# Patient Record
Sex: Male | Born: 1955 | Race: Black or African American | Hispanic: No | Marital: Married | Smoking: Never smoker
Health system: Southern US, Community
[De-identification: ages and names within clinical notes are randomized; demographics above are authoritative.]

## PROBLEM LIST (undated history)

## (undated) DIAGNOSIS — J42 Unspecified chronic bronchitis: Secondary | ICD-10-CM

## (undated) DIAGNOSIS — E119 Type 2 diabetes mellitus without complications: Secondary | ICD-10-CM

## (undated) DIAGNOSIS — E079 Disorder of thyroid, unspecified: Secondary | ICD-10-CM

---

## 2008-09-07 ENCOUNTER — Encounter: Admission: RE | Admit: 2008-09-07 | Discharge: 2008-09-07 | Payer: Self-pay | Admitting: Family Medicine

## 2009-02-06 ENCOUNTER — Ambulatory Visit (HOSPITAL_BASED_OUTPATIENT_CLINIC_OR_DEPARTMENT_OTHER): Admission: RE | Admit: 2009-02-06 | Discharge: 2009-02-07 | Payer: Self-pay | Admitting: Orthopedic Surgery

## 2010-07-05 ENCOUNTER — Encounter: Payer: Self-pay | Admitting: Family Medicine

## 2010-09-19 LAB — BASIC METABOLIC PANEL
BUN: 14 mg/dL (ref 6–23)
CO2: 26 mEq/L (ref 19–32)
Calcium: 9.2 mg/dL (ref 8.4–10.5)
GFR calc Af Amer: >60 mL/min (ref >60)
GFR calc non Af Amer: >60 mL/min (ref >60)
Potassium: 3.9 mEq/L (ref 3.5–5.1)

## 2010-09-19 LAB — GLUCOSE, CAPILLARY
Glucose-Capillary: 109 mg/dL — ABNORMAL HIGH (ref 70–99)
Glucose-Capillary: 135 mg/dL — ABNORMAL HIGH (ref 70–99)

## 2010-10-27 NOTE — Op Note (Signed)
Jordan Eaton, Jordan Eaton             ACCOUNT NO.:  [id number]   MEDICAL RECORD NO.:  [id number]          PATIENT TYPE:  AMB   LOCATION:  DSC                          FACILITY:  [facility]   PHYSICIAN:  Katy Fitch. Sypher, M.D. DATE OF BIRTH:  1955-09-09   DATE OF PROCEDURE:  02/06/2009  DATE OF DISCHARGE:                               OPERATIVE REPORT   PREOPERATIVE DIAGNOSES:  Chronic right shoulder pain status post prior  right arthroscopic rotator cuff repair, subacromial decompression, and  limited distal clavicle resection performed in February 2009.   POSTOPERATIVE DIAGNOSES:  Moderate capsular contracture, limited labral  degenerative changes, possible residual pain from narrow footprint  rotator cuff repaired with an approximately 5-mm footprint equivalent to  an acquired partial-thickness articular-surface tendon avulsion,  articular surface tear, and prominent residual distal clavicle.   OPERATIONS:  1. Examination of right shoulder under anesthesia demonstrating a      stable shoulder joint with slight loss of external rotation and      extension due to mild adhesive capsulitis.  2. Limited labral degenerative changes, treated with simple      debridement.  3. Arthroscopic documentation of footprint of rotator cuff repair      demonstrating about a 5-mm footprint with extensive partial      articular surface undermining and pseudo tendon noted intra-      articularly, perhaps the source of pain.  4. Prominent inferior distal clavicle, treated with operative      resection of approximately 12 mm of the distal clavicle.   OPERATING SURGEON:  Katy Fitch. Sypher, MD   ASSISTANT:  Marveen Reeks Dasnoit, PA-C   ANESTHESIA:  General by endotracheal technique supplemented by a right  interscalene block.   SUPERVISING ANESTHESIOLOGIST:  Germaine Pomfret, MD   INDICATIONS:  Jordan Eaton is a 55 year old gentleman employed by the  [place] [employer] in the [place] [employer].   In 2008, he sustained a significant injury to his right shoulder.  He  was initially treated at [facility] and subsequently seen by Dr. Francena Hanly with [facility].  Dr. Rennis Chris identified a rotator  cuff tear and signs of chronic impingement.  He took Jordan Eaton to the  operating room in February 2009 and performed an arthroscopic  subacromial decompression, a rotator cuff repair, labral debridement,  and a limited resection of the distal clavicle.   Jordan Eaton went to rehab and had persistent impingement-type symptoms.  These were treated with steroid injection and therapy.  With persistent  pain and alternative, upper extremity orthopedic consult was requested.   Jordan Eaton reported chronic night pain, pain sleeping on the shoulder,  impingement-type symptoms, and on exam appeared to have evidence of  residual impingement beneath the distal clavicle.   His MRI following surgery was studied and had what appeared to be an  intact rotator cuff repair, although it was challenging to interpret the  dimensions of the rotator cuff footprint.   Due to his persistent pain and work impairment, we recommended a re-  examination of his shoulder under anesthesia and arthroscopic evaluation  of the repair as well as his distal clavicle.   Preoperatively, he complained that he could not lift his body weight up  into his trucks at work.  He had enough shoulder impairment that he did  not feel he could return to work.  It should be noted that he is a very  large man weighing 270 pounds.   We advised him that we would do our best to proceed with a orderly  evaluation of his shoulder.  Should we find that his rotator cuff repair  has partially failed, it may be necessary to repeat his rotator cuff  repair.   Questions regarding the anticipated surgery were invited and answered in  detail.  He was interviewed by Dr. Jean Rosenthal preoperatively and advised to  undergo  interscalene block.  This was placed without complication in the  holding area.   After a proper site identification protocol was followed, Jordan Eaton is  brought to the operating room at this time.   PROCEDURE IN DETAIL:  Jordan Eaton was brought to room 5 at the [facility] and placed in supine position on the operating table.  Under Dr. Edison Pace direct supervision, general endotracheal anesthesia  was induced followed by careful positioning in the beach-chair position  with the aid of a torso and head holder designed for shoulder  arthroscopy.   Examination of right shoulder under anesthesia revealed combined  elevation to 170, external rotation to 85 degrees, extension to neutral,  and internal rotation of at least 70 degrees.  He was stable to anterior-  posterior testing.   The right arm and forequarter were then prepped with DuraPrep and draped  with impervious arthroscopy drapes.  The shoulder was distended with 20  mL of sterile saline with a spinal needle anteriorly and a scope placed  over a switching stick from posterior approach with blunt technique.   Diagnostic arthroscopy revealed limited labral degenerative changes at 2  o'clock anteriorly, superiorly, and posteriorly.  There was some  capsular fibrosis from his previous anterior portal that was adhering  the subscapularis and perhaps limiting his abduction and external  rotation.   An anterior portal was created under direct vision followed by  meticulous debridement of the capsular adhesions off the subscapularis  and limited debridement of the labrum.  The rotator cuff tear was  inspected.  The suture anchors were visible.  There was a fairly  significant 1 cm wide or more medial footprint with relatively lateral  placements of the anchor relative to the articular margin.  This had the  appearance of an acquired PASTA type lesion.  A spinal needle was used  in an effort to gauge the thickness of  the repair footprint.  Our best  estimate was approximately 15 mm based on palpation off the skin margin  with penetration of the tendon.   The repair and sutures visualized were documented in the digital camera.   The operating room did not have a [place] rotator cuff probe to  precisely measure the footprint.   The scope was removed from the glenohumeral joint and placed in the  subacromial space.  There was florid bursitis noted.  After cleaning the  bursa, the anatomy of the coracoacromial arch was studied.  A very  satisfactory acromioplasty was performed at the index surgery.  The  coracoacromial ligament was present.  There were adhesions between the  coracoacromial ligament and the rotator cuff which were lysed with a  suction shaver  and electrocautery.  The profile of the clavicle was  noted to be prominent.  This was identified and documented in the  digital camera followed by use of a suction bur to resect the distal 12-  mm clavicle circumferentially.  Documentation of final clavicle  resection was accomplished with a digital camera.   We carefully inspected the rotator cuff repair from the bursal side.  The arthroscopic replaced knots were covered with scar but visible.  This was an intact repair with a limited footprint.   Given our preoperative alliance and contract to proceed with distal  clavicle resection and examination, no effort was made to change the  footprint of the repair.  We will rehabilitate Jordan Eaton following this  procedure and determine whether or not further intervention would be  advised.   COMPLICATIONS:  There were no apparent complications.      Katy Fitch Sypher, M.D.  Electronically Signed     RVS/MEDQ  D:  02/06/2009  T:  02/07/2009  Job:  [id number]

## 2011-06-30 ENCOUNTER — Ambulatory Visit (INDEPENDENT_AMBULATORY_CARE_PROVIDER_SITE_OTHER): Payer: Medicare Other

## 2011-06-30 DIAGNOSIS — I10 Essential (primary) hypertension: Secondary | ICD-10-CM

## 2011-06-30 DIAGNOSIS — M549 Dorsalgia, unspecified: Secondary | ICD-10-CM

## 2011-06-30 DIAGNOSIS — Z23 Encounter for immunization: Secondary | ICD-10-CM

## 2011-06-30 DIAGNOSIS — E119 Type 2 diabetes mellitus without complications: Secondary | ICD-10-CM

## 2011-07-13 ENCOUNTER — Other Ambulatory Visit: Payer: Self-pay | Admitting: Family Medicine

## 2011-07-13 DIAGNOSIS — M545 Low back pain, unspecified: Secondary | ICD-10-CM

## 2011-07-17 ENCOUNTER — Other Ambulatory Visit: Payer: Self-pay

## 2011-07-18 ENCOUNTER — Ambulatory Visit
Admission: RE | Admit: 2011-07-18 | Discharge: 2011-07-18 | Disposition: A | Discharge status: Home / Self Care | Payer: Self-pay | Source: Ambulatory Visit | Attending: Family Medicine | Admitting: Family Medicine

## 2011-07-18 DIAGNOSIS — M545 Low back pain: Secondary | ICD-10-CM

## 2011-08-31 ENCOUNTER — Encounter: Payer: Self-pay | Admitting: Family Medicine

## 2011-10-01 ENCOUNTER — Other Ambulatory Visit: Payer: Self-pay | Admitting: Family Medicine

## 2011-10-02 ENCOUNTER — Telehealth: Payer: Self-pay

## 2011-10-02 NOTE — Telephone Encounter (Signed)
Verfied pharmacy to fax RX

## 2011-11-01 ENCOUNTER — Ambulatory Visit (INDEPENDENT_AMBULATORY_CARE_PROVIDER_SITE_OTHER): Payer: Medicare Other | Admitting: Family Medicine

## 2011-11-01 DIAGNOSIS — E119 Type 2 diabetes mellitus without complications: Secondary | ICD-10-CM

## 2011-11-01 DIAGNOSIS — M549 Dorsalgia, unspecified: Secondary | ICD-10-CM

## 2011-11-01 DIAGNOSIS — M6289 Other specified disorders of muscle: Secondary | ICD-10-CM

## 2011-11-01 DIAGNOSIS — I10 Essential (primary) hypertension: Secondary | ICD-10-CM

## 2011-11-01 LAB — BASIC METABOLIC PANEL
BUN: 15 mg/dL (ref 6–23)
Chloride: 105 mEq/L (ref 96–112)
Glucose, Bld: 115 mg/dL — ABNORMAL HIGH (ref 70–99)
Potassium: 4.3 mEq/L (ref 3.5–5.3)
Sodium: 141 mEq/L (ref 135–145)

## 2011-11-01 LAB — POCT CBC
Granulocyte percent: 59.5 %G (ref 37–80)
HCT, POC: 46 % (ref 43.5–53.7)
MCV: 91 fL (ref 80–97)
POC Granulocyte: 5.2 (ref 2–6.9)
RBC: 5.05 M/uL (ref 4.69–6.13)

## 2011-11-01 MED ORDER — HYDROCODONE-ACETAMINOPHEN 5-325 MG PO TABS: 1.0000 | ORAL_TABLET | Freq: Three times a day (TID) | ORAL | Status: DC | PRN

## 2011-11-01 NOTE — Progress Notes (Signed)
Patient Name: Jordan Eaton Date of Birth: [birth year] Medical Record Number: [id number] Gender: male Date of Encounter: 11/01/2011  History of Present Illness:  Jordan Eaton is a 56 y.o. very pleasant male patient who presents with the following:  Here today to follow-up his DM and chroinic pain.  From what he tells me he did see Dr. Althea Charon at [facility] ortho- however it sounds like he did not want to follow- through with the possible plan of doing injections for his back. He continues to have have chronic shoulder and back pain, but states that he is taking his norco BID or TID.  He does not always have to take it- his pain seems to wax and wane.  He actually plans to have shoulder surgery in a few months  There is no problem list on file for this patient.  No past medical history on file. No past surgical history on file. History  Substance Use Topics  . Smoking status: Never Smoker   . Smokeless tobacco: Not on file  . Alcohol Use: No   No family history on file. No Known Allergies  Medication list has been reviewed and updated.  Review of Systems: As per HPI- otherwise negative.   Physical Examination: Filed Vitals:   11/01/11 1154  BP: 115/77  Pulse: 82  Temp: 97.9 F (36.6 C)  TempSrc: Oral  Resp: 16  Height: 5\' 9"  (1.753 m)  Weight: 261 lb 12.8 oz (118.752 kg)  SpO2: 100%    Body mass index is 38.66 kg/(m^2).  GEN: WDWN, NAD, Non-toxic, A & O x 3, obese HEENT: Atraumatic, Normocephalic. Neck supple. No masses, No LAD.  TM, oropharynx wnl Ears and Nose: No external deformity. CV: RRR, No M/G/R. No JVD. No thrill. No extra heart sounds. PULM: CTA B, no wheezes, crackles, rhonchi. No retractions. No resp. distress. No accessory muscle use. ABD: S, NT, ND, +BS. No rebound. No HSM. EXTR: No c/c/e NEURO Normal gait.  PSYCH: Normally interactive. Conversant. Not depressed or anxious appearing.  Calm demeanor.  Back: tender lower back bilaterally- lumbar  area.  Limited flexion and extension.  Negative straight leg raise and normal strength, sensation.    Results for orders placed in visit on 11/01/11  POCT CBC      Component Value Range   WBC 8.8  4.6 - 10.2 (K/uL)   Lymph, poc 2.8  0.6 - 3.4    POC LYMPH PERCENT 32.2  10 - 50 (%L)   MID (cbc) 0.7  0 - 0.9    POC MID % 8.3  0 - 12 (%M)   POC Granulocyte 5.2  2 - 6.9    Granulocyte percent 59.5  37 - 80 (%G)   RBC 5.05  4.69 - 6.13 (M/uL)   Hemoglobin 15.1  14.1 - 18.1 (g/dL)   HCT, POC 40.9  81.1 - 53.7 (%)   MCV 91.0  80 - 97 (fL)   MCH, POC 29.9  27 - 31.2 (pg)   MCHC 32.8  31.8 - 35.4 (g/dL)   RDW, POC 91.4     Platelet Count, POC 282  142 - 424 (K/uL)   MPV 9.4  0 - 99.8 (fL)  POCT GLYCOSYLATED HEMOGLOBIN (HGB A1C)      Component Value Range   Hemoglobin A1C 7.1     Assessment and Plan: 1. Diabetes mellitus  POCT CBC, POCT glycosylated hemoglobin (Hb A1C)  2. Hypertension  Basic metabolic panel  3. Back pain, chronic  HYDROcodone-acetaminophen (NORCO) 5-325  MG per tablet   Discussed his recent MRI and his chronic back pain- he last received #60 norco about a month ago.  We do not mind treating his pain, but encouraged him to reconsider having a more permanent solution as per ortho. He will think about this, but for now is not interested in doing anything more invasive.    DM control is ok, blood pressure looks fine.  Will plan further follow- up pending labs.

## 2011-11-02 ENCOUNTER — Encounter: Payer: Self-pay | Admitting: Family Medicine

## 2011-12-30 ENCOUNTER — Ambulatory Visit (INDEPENDENT_AMBULATORY_CARE_PROVIDER_SITE_OTHER): Payer: Medicare Other | Admitting: Family Medicine

## 2011-12-30 VITALS — BP 120/78 | HR 82 | Temp 97.5°F | Resp 18 | Ht 70.0 in | Wt 261.0 lb

## 2011-12-30 DIAGNOSIS — J029 Acute pharyngitis, unspecified: Secondary | ICD-10-CM

## 2011-12-30 DIAGNOSIS — R05 Cough: Secondary | ICD-10-CM

## 2011-12-30 LAB — POCT RAPID STREP A (OFFICE): Rapid Strep A Screen: NEGATIVE

## 2011-12-30 MED ORDER — AZITHROMYCIN 250 MG PO TABS: ORAL_TABLET | ORAL | Status: AC

## 2011-12-30 MED ORDER — HYDROCODONE-HOMATROPINE 5-1.5 MG/5ML PO SYRP: 5.0000 mL | ORAL_SOLUTION | Freq: Three times a day (TID) | ORAL | Status: AC | PRN

## 2011-12-30 NOTE — Progress Notes (Signed)
[facility] [address], [place] [address] [phone]  Date:  12/30/2011   Name:  Jordan Eaton   DOB:  1955-07-19   MRN:  [id number]  PCP:  No primary provider on file.    Chief Complaint: Sore Throat and Cough   History of Present Illness:  Jordan Eaton is a 56 y.o. very pleasant male patient who presents with the following:  History of DM and chronic shoulder and back pain.  He has noted a cough and ST- he has also noted some chest congestion, and some productive cough.  He has been ill for about 5 days. He has not noted a fever or chills.  His grandson has been ill recently as well.  Otherwise he has been doing well recently.    There is no problem list on file for this patient.   No past medical history on file.  No past surgical history on file.  History  Substance Use Topics  . Smoking status: Never Smoker   . Smokeless tobacco: Not on file  . Alcohol Use: No    No family history on file.  No Known Allergies  Medication list has been reviewed and updated.  Current Outpatient Prescriptions on File Prior to Visit  Medication Sig Dispense Refill  . HYDROcodone-acetaminophen (NORCO) 5-325 MG per tablet Take 1 tablet by mouth every 8 (eight) hours as needed for pain.  60 tablet  0  . lisinopril (PRINIVIL,ZESTRIL) 5 MG tablet Take 5 mg by mouth daily.      . metFORMIN (GLUCOPHAGE) 1000 MG tablet Take 1,000 mg by mouth 2 (two) times daily with a meal.        Review of Systems:  As per HPI- otherwise negative.   Physical Examination: Filed Vitals:   12/30/11 1146  BP: 120/78  Pulse: 82  Temp: 97.5 F (36.4 C)  Resp: 18   Filed Vitals:   12/30/11 1146  Height: 5\' 10"  (1.778 m)  Weight: 261 lb (118.389 kg)   Body mass index is 37.45 kg/(m^2). Ideal Body Weight: Weight in (lb) to have BMI = 25: 173.9   GEN: WDWN, NAD, Non-toxic, A & O x 3, obese HEENT: Atraumatic, Normocephalic. Neck supple. No masses, No LAD.  TM and  nasal cavity wnl. Oropharynx slightly injected but no exudate.   Ears and Nose: No external deformity. CV: RRR, No M/G/R. No JVD. No thrill. No extra heart sounds. PULM: CTA B, no wheezes, crackles, rhonchi. No retractions. No resp. distress. No accessory muscle use. EXTR: No c/c/e NEURO Normal gait.  PSYCH: Normally interactive. Conversant. Not depressed or anxious appearing.  Calm demeanor.   Results for orders placed in visit on 12/30/11  POCT RAPID STREP A (OFFICE)      Component Value Range   Rapid Strep A Screen Negative  Negative    Assessment and Plan: 1. Sore throat  POCT rapid strep A  2. Cough  azithromycin (ZITHROMAX) 250 MG tablet, HYDROcodone-homatropine (HYCODAN) 5-1.5 MG/5ML syrup   Treat as above for bronchitis.  He knows to use EITHER the vicodin OR hycodan- not both.  Patient (or parent if minor) instructed to return to clinic or call if not better in 2-3 day(s).  Sooner if worse.      Abbe Amsterdam, MD

## 2012-02-01 ENCOUNTER — Other Ambulatory Visit: Payer: Self-pay | Admitting: Physician Assistant

## 2012-03-15 ENCOUNTER — Ambulatory Visit: Payer: Medicare Other

## 2012-03-15 ENCOUNTER — Ambulatory Visit (INDEPENDENT_AMBULATORY_CARE_PROVIDER_SITE_OTHER): Payer: Medicare Other | Admitting: Family Medicine

## 2012-03-15 VITALS — BP 118/76 | HR 85 | Temp 98.0°F | Resp 17 | Ht 69.0 in | Wt 258.0 lb

## 2012-03-15 DIAGNOSIS — E119 Type 2 diabetes mellitus without complications: Secondary | ICD-10-CM

## 2012-03-15 DIAGNOSIS — Z23 Encounter for immunization: Secondary | ICD-10-CM

## 2012-03-15 DIAGNOSIS — M549 Dorsalgia, unspecified: Secondary | ICD-10-CM

## 2012-03-15 DIAGNOSIS — M25569 Pain in unspecified knee: Secondary | ICD-10-CM

## 2012-03-15 DIAGNOSIS — M79643 Pain in unspecified hand: Secondary | ICD-10-CM

## 2012-03-15 DIAGNOSIS — M79671 Pain in right foot: Secondary | ICD-10-CM

## 2012-03-15 DIAGNOSIS — M79609 Pain in unspecified limb: Secondary | ICD-10-CM

## 2012-03-15 DIAGNOSIS — M2550 Pain in unspecified joint: Secondary | ICD-10-CM

## 2012-03-15 LAB — BASIC METABOLIC PANEL
BUN: 14 mg/dL (ref 6–23)
Chloride: 103 mEq/L (ref 96–112)
Potassium: 4.5 mEq/L (ref 3.5–5.3)
Sodium: 139 mEq/L (ref 135–145)

## 2012-03-15 LAB — RHEUMATOID FACTOR: Rhuematoid fact SerPl-aCnc: <10 IU/mL (ref <=14)

## 2012-03-15 LAB — POCT SEDIMENTATION RATE: POCT SED RATE: 20 mm/hr (ref 0–22)

## 2012-03-15 MED ORDER — HYDROCODONE-ACETAMINOPHEN 5-325 MG PO TABS: 1.0000 | ORAL_TABLET | Freq: Three times a day (TID) | ORAL | Status: AC | PRN

## 2012-03-15 NOTE — Progress Notes (Signed)
[facility] [address] [place] [address] [phone]  Date:  03/15/2012   Name:  Jordan Eaton   DOB:  Nov 03, 1955   MRN:  [id number]  PCP:  No primary provider on file.    Chief Complaint: Back Pain, Hand Pain and Knee Pain   History of Present Illness:  Jordan Eaton is a 56 y.o. very pleasant male patient who presents with the following:  History of chronic back/ generalized pain, as well as DM.  He is here with back, shoulder, hand and neck pain today.  He would also like to do his periodic DM check and have a flu shot.  He has been taking just 1gm of Glucophage a day for the last couple of months.  His sugar does not get below around 80.  However, he feels shaky even when he gets down below 100.    Right lower back pain: this was worse this am, but has been bothering him for a couple of months.  It seems to have moved from the left to the right.  "like a knife stuck in my spine."  He has thought to have mild spinal stenosis.    He also has pain in his right hand.  It will swell sometimes, and his grip is not as strong as he would like.  This has gone on for a few weeks.   He also continues to have shoulder pain.  This is not new He has seen [facility] orth[facility] earlier this year.  He was told that he needs surgery on his left shoulder.  He also has pain in his right shoulder- he has had surgical repairs of his RC in the past- 2009/ 2010?   He also has pain in his left knee- it has bothered him more for about one week.  There was no known injury.    He is taking advil- 4 pills once a day or so.   He is also using hydrocodone- 5 mg twice a day- he uses this as needed.  He uses it 3 or 4 times a week.  Our records show that he received 30 on 02/02/12  We have tried prozac for him in past, but he stopped taking it because he felt that it made him nervous, he has also tried another SSRI but stopped that as well.  He does admit that he is depressed.  However, he  has no SI.   He has more pain with cooler weather or changes in the weather.    MRI 07/2011: IMPRESSION:  1. Small L3-L4 and L4-L5 central disc protrusions without central  stenosis. L4-L5 protrusion is slightly larger with minimal  encroachment in the left lateral recess but no displacement of the  descending L5 nerve root or compression.  2. Unilateral right L5 pars defect. No inflammatory changes. No  spondylolisthesis.   There is no problem list on file for this patient.   No past medical history on file.  No past surgical history on file.  History  Substance Use Topics  . Smoking status: Never Smoker   . Smokeless tobacco: Not on file  . Alcohol Use: No    No family history on file.  No Known Allergies  Medication list has been reviewed and updated.  Current Outpatient Prescriptions on File Prior to Visit  Medication Sig Dispense Refill  . HYDROcodone-acetaminophen (NORCO/VICODIN) 5-325 MG per tablet TAKE ONE TABLET BY MOUTH THREE TIMES DAILY AS NEEDED  30 tablet  0  . lisinopril (PRINIVIL,ZESTRIL) 5 MG tablet Take 5 mg by mouth daily.      . metFORMIN (GLUCOPHAGE) 1000 MG tablet Take 1,000 mg by mouth 2 (two) times daily with a meal.        Review of Systems:  As per HPI- otherwise negative. No other systemic symptoms.  He does note some tingling and numbness in his feet that seems to come and go  Physical Examination: Filed Vitals:   03/15/12 0934  BP: 118/76  Pulse: 85  Temp: 98 F (36.7 C)  Resp: 17   Filed Vitals:   03/15/12 0934  Height: 5\' 9"  (1.753 m)  Weight: 258 lb (117.028 kg)   Body mass index is 38.10 kg/(m^2). Ideal Body Weight: Weight in (lb) to have BMI = 25: 168.9   GEN: WDWN, NAD, Non-toxic, A & O x 3, obese HEENT: Atraumatic, Normocephalic. Neck supple. No masses, No LAD. Bilateral TM wnl, oropharynx normal.  PEERL,EOMI.   Ears and Nose: No external deformity. CV: RRR, No M/G/R. No JVD. No thrill. No extra heart sounds. PULM:  CTA B, no wheezes, crackles, rhonchi. No retractions. No resp. distress. No accessory muscle use. ABD: S, NT, ND, +BS. No rebound. No HSM. EXTR: No c/c/e.  Normal sensation to monofilament testing of both feet.  Feet have appearance of normal health and circulation.   Right hand: decreased grip strength, but no obvious swelling, no ulnar deviation or joint nodules Left knee: medial joint line pain.  Normal ROM, no swelling, redness or effusion.  Joint is stable Both shoulders with tenderness over the anterior RC attachment.  Right shoulder with good ROM with minimal pain.  Left shoulder with tenderness with flexion, internal and external rotation.   NEURO Normal gait.  PSYCH: Normally interactive. Conversant. Seems depressed. Calm demeanor.   [facility] reading (PRIMARY) by  Dr. Patsy Lager. Right hand: normal   Left knee: normal LEFT KNEE - COMPLETE 4+ VIEW  Comparison: None.  Findings: Normal alignment. No fracture. Preserved joint spaces. No significant arthropathy. No effusion. Patella located.  IMPRESSION: No acute osseous finding.  RIGHT HAND - 2 VIEW  Comparison: None.  Findings: Two-view exam. Normal alignment. No fracture. Preserved joint spaces. No significant arthropathy. No radiographic swelling or radiopaque foreign body.  IMPRESSION: No acute osseous finding.   Results for orders placed in visit on 03/15/12  POCT GLYCOSYLATED HEMOGLOBIN (HGB A1C)      Component Value Range   Hemoglobin A1C 8.0    POCT SEDIMENTATION RATE      Component Value Range   POCT SED RATE 20  0 - 22 mm/hr    Assessment and Plan: 1. Pain in back  HYDROcodone-acetaminophen (NORCO/VICODIN) 5-325 MG per tablet  2. Diabetes mellitus type II  POCT glycosylated hemoglobin (Hb A1C), Basic metabolic panel, Flu vaccine greater than or equal to 3yo preservative free IM  3. Pain in joints  POCT SEDIMENTATION RATE, Rheumatoid factor, ANA  4. Pain, knee  DG Knee Complete 4 Views Left  5. Pain, hand  DG  Hand 2 View Right  6. Pain in both feet     Jordan Eaton is here with multiple pains today.  I suspect that he has an element of depression which likely contributes to his pains.  He is not willing to try another SSRI or wellbutrin today.  He denies any SI.   Will give him some vicodin to use as needed.  He is aware that this medication can be habit forming.  Will pursue testing to be sure he does not have any auto- immune disease.  His ESR is normal so this is unlikely but I will follow- up with him when his results come in.    His A1c is running higher.  Encouraged him to increase his metformin to BID again.   Abbe Amsterdam, Jordan

## 2012-03-16 ENCOUNTER — Encounter: Payer: Self-pay | Admitting: Family Medicine

## 2012-03-22 ENCOUNTER — Encounter: Payer: Self-pay | Admitting: Family Medicine

## 2012-03-22 DIAGNOSIS — M549 Dorsalgia, unspecified: Secondary | ICD-10-CM

## 2012-03-22 DIAGNOSIS — E119 Type 2 diabetes mellitus without complications: Secondary | ICD-10-CM | POA: Insufficient documentation

## 2012-03-22 DIAGNOSIS — G8929 Other chronic pain: Secondary | ICD-10-CM | POA: Insufficient documentation

## 2012-03-22 DIAGNOSIS — I10 Essential (primary) hypertension: Secondary | ICD-10-CM | POA: Insufficient documentation

## 2012-03-22 DIAGNOSIS — M25519 Pain in unspecified shoulder: Secondary | ICD-10-CM | POA: Insufficient documentation

## 2012-03-22 DIAGNOSIS — IMO0002 Reserved for concepts with insufficient information to code with codable children: Secondary | ICD-10-CM | POA: Insufficient documentation

## 2012-05-06 ENCOUNTER — Other Ambulatory Visit: Payer: Self-pay | Admitting: Family Medicine

## 2012-06-19 ENCOUNTER — Other Ambulatory Visit: Payer: Self-pay | Admitting: Family Medicine

## 2013-01-22 DIAGNOSIS — Z0271 Encounter for disability determination: Secondary | ICD-10-CM

## 2013-08-26 ENCOUNTER — Other Ambulatory Visit: Payer: Self-pay | Admitting: Physician Assistant

## 2013-08-29 ENCOUNTER — Other Ambulatory Visit: Payer: Self-pay | Admitting: Physician Assistant

## 2021-08-23 ENCOUNTER — Encounter (HOSPITAL_BASED_OUTPATIENT_CLINIC_OR_DEPARTMENT_OTHER): Payer: Self-pay | Admitting: *Deleted

## 2021-08-23 ENCOUNTER — Emergency Department (HOSPITAL_BASED_OUTPATIENT_CLINIC_OR_DEPARTMENT_OTHER): Payer: Medicare Other

## 2021-08-23 ENCOUNTER — Other Ambulatory Visit: Payer: Self-pay

## 2021-08-23 ENCOUNTER — Emergency Department (HOSPITAL_BASED_OUTPATIENT_CLINIC_OR_DEPARTMENT_OTHER)
Admission: EM | Admit: 2021-08-23 | Discharge: 2021-08-23 | Disposition: A | Discharge status: Home / Self Care | Payer: Medicare Other | Attending: Emergency Medicine | Admitting: Emergency Medicine

## 2021-08-23 DIAGNOSIS — J40 Bronchitis, not specified as acute or chronic: Secondary | ICD-10-CM | POA: Diagnosis not present

## 2021-08-23 DIAGNOSIS — R0602 Shortness of breath: Secondary | ICD-10-CM | POA: Insufficient documentation

## 2021-08-23 DIAGNOSIS — M7989 Other specified soft tissue disorders: Secondary | ICD-10-CM | POA: Diagnosis not present

## 2021-08-23 DIAGNOSIS — Z7984 Long term (current) use of oral hypoglycemic drugs: Secondary | ICD-10-CM | POA: Diagnosis not present

## 2021-08-23 DIAGNOSIS — E119 Type 2 diabetes mellitus without complications: Secondary | ICD-10-CM | POA: Diagnosis not present

## 2021-08-23 DIAGNOSIS — R059 Cough, unspecified: Secondary | ICD-10-CM | POA: Diagnosis present

## 2021-08-23 HISTORY — DX: Disorder of thyroid, unspecified: E07.9

## 2021-08-23 HISTORY — DX: Type 2 diabetes mellitus without complications: E11.9

## 2021-08-23 HISTORY — DX: Unspecified chronic bronchitis: J42

## 2021-08-23 LAB — BASIC METABOLIC PANEL
Anion gap: 9 (ref 5–15)
BUN: 10 mg/dL (ref 8–23)
CO2: 25 mmol/L (ref 22–32)
Calcium: 8.8 mg/dL — ABNORMAL LOW (ref 8.9–10.3)
Chloride: 100 mmol/L (ref 98–111)
Creatinine, Ser: 1.11 mg/dL (ref 0.61–1.24)
GFR, Estimated: >60 mL/min (ref >60)
Glucose, Bld: 264 mg/dL — ABNORMAL HIGH (ref 70–99)
Potassium: 3.7 mmol/L (ref 3.5–5.1)
Sodium: 134 mmol/L — ABNORMAL LOW (ref 135–145)

## 2021-08-23 LAB — CBC
HCT: 43.7 % (ref 39.0–52.0)
Hemoglobin: 14.7 g/dL (ref 13.0–17.0)
MCH: 30.1 pg (ref 26.0–34.0)
MCHC: 33.6 g/dL (ref 30.0–36.0)
MCV: 89.4 fL (ref 80.0–100.0)
Platelets: 218 10*3/uL (ref 150–400)
RBC: 4.89 MIL/uL (ref 4.22–5.81)
RDW: 13.3 % (ref 11.5–15.5)
WBC: 6.3 10*3/uL (ref 4.0–10.5)
nRBC: 0 % (ref 0.0–0.2)

## 2021-08-23 LAB — BRAIN NATRIURETIC PEPTIDE: B Natriuretic Peptide: 20.6 pg/mL (ref 0.0–100.0)

## 2021-08-23 LAB — TROPONIN I (HIGH SENSITIVITY): Troponin I (High Sensitivity): 3 ng/L (ref <18)

## 2021-08-23 MED ORDER — IPRATROPIUM-ALBUTEROL 0.5-2.5 (3) MG/3ML IN SOLN
3.0000 mL | Freq: Once | RESPIRATORY_TRACT | Status: AC
Start: 2021-08-23 — End: 2021-08-23
  Administered 2021-08-23: 3 mL via RESPIRATORY_TRACT
  Filled 2021-08-23: qty 3

## 2021-08-23 MED ORDER — DOXYCYCLINE HYCLATE 100 MG PO CAPS: 100.0000 mg | ORAL_CAPSULE | Freq: Two times a day (BID) | ORAL | 0 refills | Status: AC

## 2021-08-23 MED ORDER — PREDNISONE 50 MG PO TABS
60.0000 mg | ORAL_TABLET | Freq: Once | ORAL | Status: AC
  Administered 2021-08-23: 60 mg via ORAL
  Filled 2021-08-23: qty 1

## 2021-08-23 MED ORDER — ALBUTEROL SULFATE (2.5 MG/3ML) 0.083% IN NEBU
5.0000 mg | INHALATION_SOLUTION | Freq: Once | RESPIRATORY_TRACT | Status: AC
Start: 2021-08-23 — End: 2021-08-23
  Administered 2021-08-23: 5 mg via RESPIRATORY_TRACT
  Filled 2021-08-23: qty 6

## 2021-08-23 MED ORDER — ALBUTEROL SULFATE HFA 108 (90 BASE) MCG/ACT IN AERS
2.0000 | INHALATION_SPRAY | RESPIRATORY_TRACT | Status: DC | PRN
  Administered 2021-08-23: 2 via RESPIRATORY_TRACT
  Filled 2021-08-23: qty 6.7

## 2021-08-23 MED ORDER — IPRATROPIUM-ALBUTEROL 0.5-2.5 (3) MG/3ML IN SOLN
3.0000 mL | Freq: Once | RESPIRATORY_TRACT | Status: AC
  Administered 2021-08-23: 3 mL via RESPIRATORY_TRACT
  Filled 2021-08-23: qty 3

## 2021-08-23 MED ORDER — DOXYCYCLINE HYCLATE 100 MG PO TABS
100.0000 mg | ORAL_TABLET | Freq: Once | ORAL | Status: AC
  Administered 2021-08-23: 100 mg via ORAL
  Filled 2021-08-23: qty 1

## 2021-08-23 MED ORDER — PREDNISONE 50 MG PO TABS: 50.0000 mg | ORAL_TABLET | Freq: Every day | ORAL | 0 refills | Status: AC

## 2021-08-23 NOTE — Discharge Instructions (Addendum)
You were given a prescription for antibiotics. Please take the antibiotic prescription fully.  ? ?Take steroids as directed  ? ?Use 2 puffs of the albuterol inhaler every 4-6 hours as needed  ? ?Please follow up with your primary care provider within 5-7 days for re-evaluation of your symptoms. If you do not have a primary care provider, information for a healthcare clinic has been provided for you to make arrangements for follow up care. Please return to the emergency department for any new or worsening symptoms. ? ?

## 2021-08-23 NOTE — ED Triage Notes (Signed)
Pt reports congested cough for several days. "Wheezing". Today he began having left side chest pain ?

## 2021-08-23 NOTE — ED Notes (Signed)
Instruction given for [facility] albuterol with aerochamber.  Proper tech demonstrate, all questions answered. ?

## 2021-08-23 NOTE — ED Provider Notes (Signed)
[facility] EMERGENCY DEPARTMENT Provider Note   CSN: [id number] Arrival date & time: 08/23/21  1502     History  Chief Complaint  Patient presents with   Cough    Jordan Eaton is a 66 y.o. male.  HPI  66 year old male with a history of thyroid disease, diabetes, chronic bronchitis, who presents emergency department today for evaluation of a cough.  He said he had a cough for the last 3 to 4 days.  He is also had some increased wheezing.  He did have some swelling in his feet.  He has had some chest pain to the left side of his chest as well.  He states this mostly happens with coughing.  He has had no fevers or chills.  He does report some rhinorrhea.  Reports some shortness of breath as well.  Home Medications Prior to Admission medications   Medication Sig Start Date End Date Taking? Authorizing Provider  doxycycline (VIBRAMYCIN) 100 MG capsule Take 1 capsule (100 mg total) by mouth 2 (two) times daily for 7 days. 08/23/21 08/30/21 Yes Ajah Vanhoose S, PA-C  predniSONE (DELTASONE) 50 MG tablet Take 1 tablet (50 mg total) by mouth daily for 5 days. 08/23/21 08/28/21 Yes Bryli Mantey S, PA-C  cyclobenzaprine (FLEXERIL) 10 MG tablet TAKE ONE TABLET BY MOUTH AT BEDTIME 05/06/12   Porfirio Oar, PA  HYDROcodone-acetaminophen (NORCO/VICODIN) 5-325 MG per tablet Take 1 tablet by mouth every 8 (eight) hours as needed for pain. 03/15/12   Copland, Gwenlyn Found, MD  lisinopril (PRINIVIL,ZESTRIL) 5 MG tablet TAKE ONE TABLET BY MOUTH EVERY DAY 06/19/12   Rhoderick Moody M, PA-C  metFORMIN (GLUCOPHAGE) 1000 MG tablet Take 1,000 mg by mouth 2 (two) times daily with a meal.    [provider]      Allergies    Patient has no known allergies.    Review of Systems   Review of Systems See HPI for pertinent positives or negatives.   Physical Exam Updated Vital Signs BP 132/74    Pulse 83    Temp 98.4 F (36.9 C) (Oral)    Resp 20    Ht 5\' 9"  (1.753 m)    Wt 121.6 kg     SpO2 100%    BMI 39.58 kg/m  Physical Exam Vitals and nursing note reviewed.  Constitutional:      General: He is not in acute distress.    Appearance: He is well-developed.  HENT:     Head: Normocephalic and atraumatic.  Eyes:     Conjunctiva/sclera: Conjunctivae normal.  Cardiovascular:     Rate and Rhythm: Normal rate and regular rhythm.     Heart sounds: Normal heart sounds. No murmur heard. Pulmonary:     Effort: Pulmonary effort is normal. No respiratory distress.     Breath sounds: Examination of the right-lower field reveals rales. Wheezing and rales present.  Abdominal:     Palpations: Abdomen is soft.     Tenderness: There is no abdominal tenderness.  Musculoskeletal:        General: No swelling.     Cervical back: Neck supple.     Comments: Trace ble edema  Skin:    General: Skin is warm and dry.     Capillary Refill: Capillary refill takes less than 2 seconds.  Neurological:     Mental Status: He is alert.  Psychiatric:        Mood and Affect: Mood normal.     ED Results / Procedures /  Treatments   Labs (all labs ordered are listed, but only abnormal results are displayed) Labs Reviewed  BASIC METABOLIC PANEL - Abnormal; Notable for the following components:      Result Value   Sodium 134 (*)    Glucose, Bld 264 (*)    Calcium 8.8 (*)    All other components within normal limits  CBC  BRAIN NATRIURETIC PEPTIDE  TROPONIN I (HIGH SENSITIVITY)  TROPONIN I (HIGH SENSITIVITY)    EKG EKG Interpretation  Date/Time:  Sunday August 23 2021 15:17:03 EDT Ventricular Rate:  88 PR Interval:  174 QRS Duration: 80 QT Interval:  352 QTC Calculation: 426 R Axis:   82 Text Interpretation: Sinus rhythm Borderline right axis deviation Borderline low voltage, extremity leads No previous tracing Confirmed by Gwyneth Sprout ([id number]) on 08/23/2021 4:05:50 PM  Radiology DG Chest Port 1 View  Result Date: 08/23/2021 CLINICAL DATA:  Congestion cough for several days,  wheezing, onset of LEFT chest pain today EXAM: PORTABLE CHEST 1 VIEW COMPARISON:  Portable exam 1532 hours without priors for comparison FINDINGS: Normal heart size and pulmonary vascularity. Prominent mediastinum, could be related to portable AP technique but mediastinal enlargement not excluded. Atherosclerotic calcification aorta. Lungs clear. No infiltrate, pleural effusion, or pneumothorax. IMPRESSION: No acute infiltrate. Prominent mediastinum, question related to technique, recommend follow-up upright PA and lateral chest radiographs to exclude mediastinal mass/adenopathy. Aortic Atherosclerosis (ICD10-I70.0). Electronically Signed   By: Ulyses Southward M.D.   On: 08/23/2021 15:46    Procedures Procedures    Medications Ordered in ED Medications  albuterol (VENTOLIN HFA) 108 (90 Base) MCG/ACT inhaler 2 puff (2 puffs Inhalation Given 08/23/21 1725)  doxycycline (VIBRA-TABS) tablet 100 mg (has no administration in time range)  ipratropium-albuterol (DUONEB) 0.5-2.5 (3) MG/3ML nebulizer solution 3 mL (3 mLs Nebulization Given 08/23/21 1540)  predniSONE (DELTASONE) tablet 60 mg (60 mg Oral Given 08/23/21 1538)  ipratropium-albuterol (DUONEB) 0.5-2.5 (3) MG/3ML nebulizer solution 3 mL (3 mLs Nebulization Given 08/23/21 1641)  albuterol (PROVENTIL) (2.5 MG/3ML) 0.083% nebulizer solution 5 mg (5 mg Nebulization Given 08/23/21 1725)    ED Course/ Medical Decision Making/ A&P                           Medical Decision Making Amount and/or Complexity of Data Reviewed Labs: ordered. Radiology: ordered.  Risk Prescription drug management.   This patient presents to the ED for concern of cough, sob, this involves an extensive number of treatment options, and is a complaint that carries with it a high risk of complications and morbidity.  The differential diagnosis includes but is not limited to uri, pneumonia, pleural effusion, ptx, covid, pe, chf   Comorbidities that complicate the patient  evaluation: Patients presentation is complicated by their history of bronchitis  Additional history obtained: Records reviewed Care Everywhere/External Records  Lab Tests: I Ordered, and personally interpreted labs.  The pertinent results include:   CBC unremarkable CMP with elevated BG, otherwise wnl Trop wnl BNP wnl  EKG Sinus rhythm Borderline right axis deviation Borderline low voltage, extremity leads No previous tracing  Imaging Studies ordered: I ordered, independently visualized, and interpreted imaging which showed  Cxr - neg  I agree with the radiologist interpretation  Cardiac Monitoring: The patient was maintained on a cardiac monitor.  I personally viewed and interpreted the cardiac monitor which showed an underlying rhythm of:  sinus rhythm  Medicines ordered and prescription drug management: I ordered medication including duo neb,  steroids  for wheezing, cough  Reevaluation of the patient after these medicines showed that the patient    improved  Critical Interventions: duo nebs, prednisone  Complexity of problems addressed: Patients presentation is most consistent with  acute complicated illness/injury requiring diagnostic workup  Disposition: After consideration of the diagnostic results and the patients response to treatment,  I feel that the patent would benefit from discharge home . Pt hx consistent with exacerbation of chronic bronchitis, will tx with steroids and albuterol for home. Will cover for possible bacterial cause given rll crackles on exam. Not hypoxic here. Low suspicion for pe or other emergent cause.   Discussed findings and plan. Advised pcp f/u and return to the ED if no improvement or worsening sxs. Pt voices understanding of the plan and reasons to return. All questions answered. Pt stable for discharge.    Final Clinical Impression(s) / ED Diagnoses Final diagnoses:  Bronchitis    Rx / DC Orders ED Discharge Orders           Ordered    doxycycline (VIBRAMYCIN) 100 MG capsule  2 times daily        08/23/21 1751    predniSONE (DELTASONE) 50 MG tablet  Daily        08/23/21 [id number]              Arian Mcquitty, Eather ColasCortni S, PA-C 08/23/21 1752    Gwyneth SproutPlunkett, Whitney, MD 08/23/21 2159

## 2021-09-16 ENCOUNTER — Encounter (HOSPITAL_COMMUNITY): Payer: Self-pay | Admitting: Radiology

## 2022-09-18 IMAGING — DX DG CHEST 1V PORT
1 series · 1 of 1 positions shown · non-contrast
Comparison: Portable exam 8450 hours without priors for comparison

CLINICAL DATA: Congestion cough for several days, wheezing, onset
of LEFT chest pain today

EXAM:
PORTABLE CHEST 1 VIEW

[chest ap]
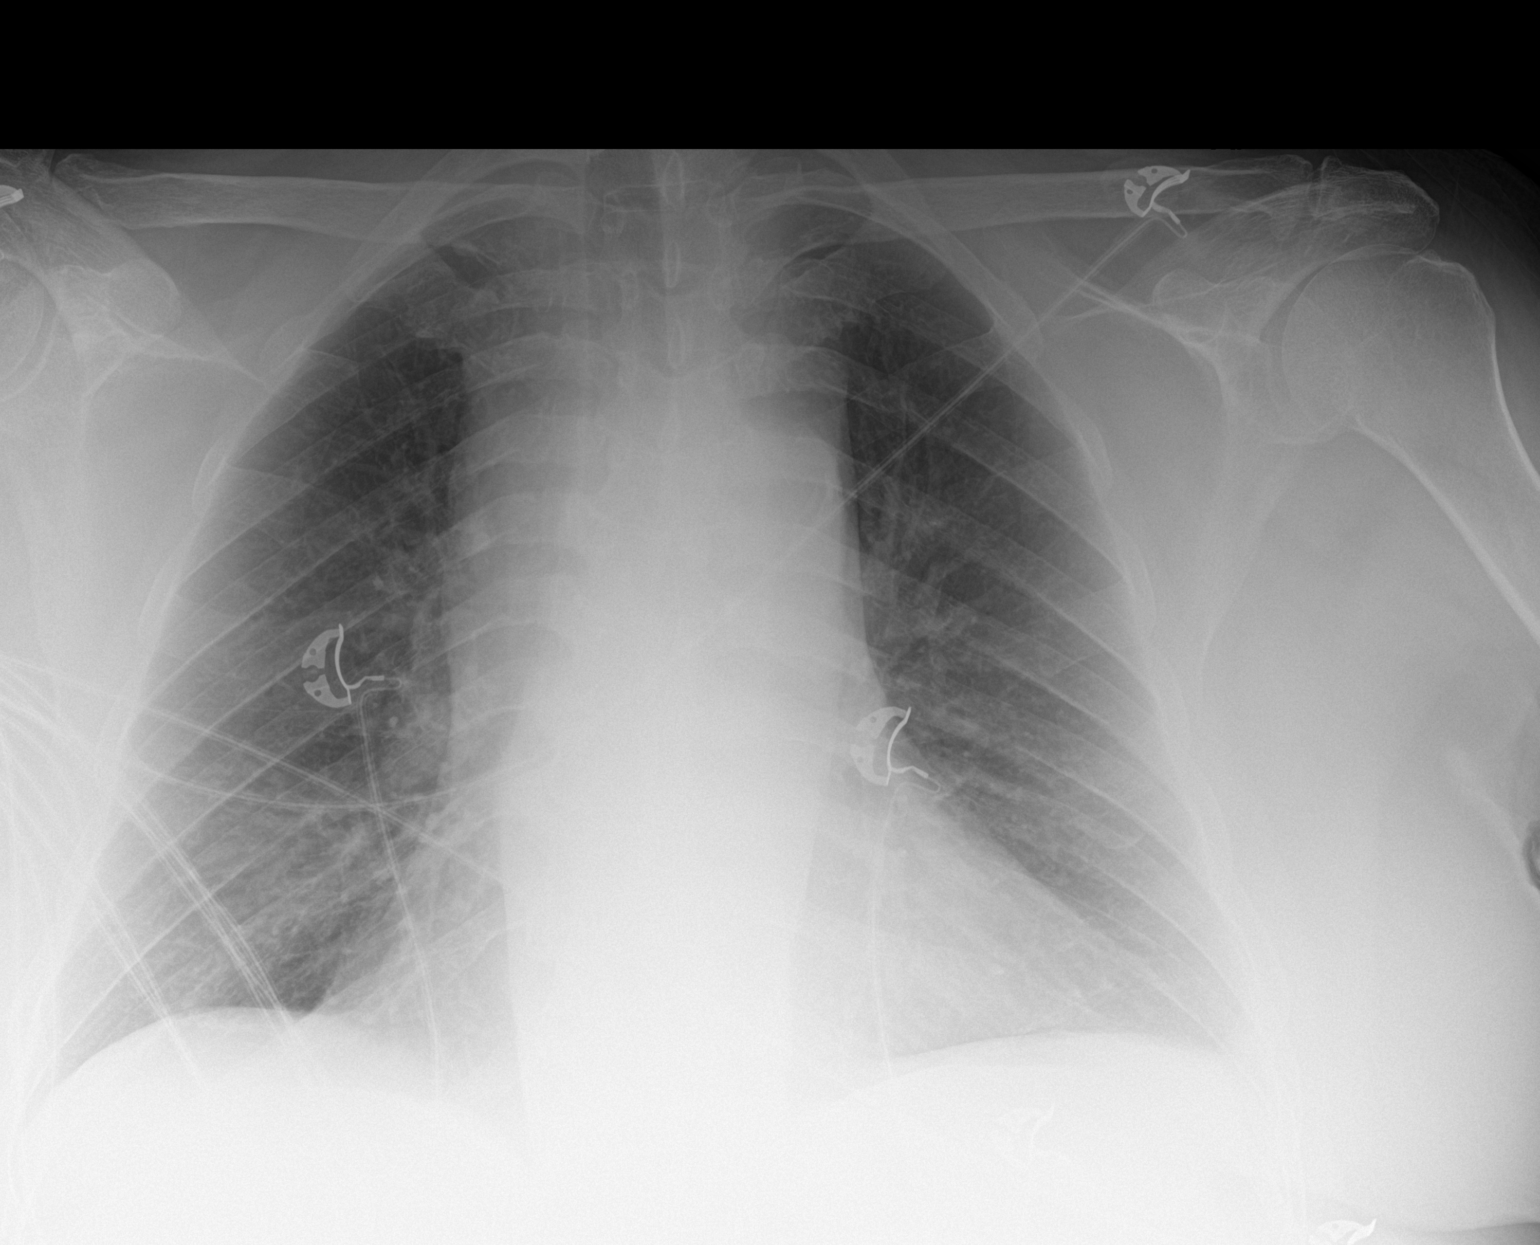

[1 of 1 positions shown; findings below may reference images not displayed]

FINDINGS: Normal heart size and pulmonary vascularity.

Prominent mediastinum, could be related to portable AP technique but
mediastinal enlargement not excluded.

Atherosclerotic calcification aorta.

Lungs clear.

No infiltrate, pleural effusion, or pneumothorax.
IMPRESSION: No acute infiltrate.

Prominent mediastinum, question related to technique, recommend
follow-up upright PA and lateral chest radiographs to exclude
mediastinal mass/adenopathy.

Aortic Atherosclerosis ([id number].H).
